# Patient Record
Sex: Female | Born: 1990 | Hispanic: No | Marital: Single | Smoking: Never smoker
Health system: Southern US, Community
[De-identification: ages and names within clinical notes are randomized; demographics above are authoritative.]

---

## 2015-04-19 ENCOUNTER — Ambulatory Visit (INDEPENDENT_AMBULATORY_CARE_PROVIDER_SITE_OTHER): Payer: Managed Care, Other (non HMO) | Admitting: Nurse Practitioner

## 2015-04-19 ENCOUNTER — Encounter: Payer: Self-pay | Admitting: Nurse Practitioner

## 2015-04-19 VITALS — BP 108/74 | HR 60 | Ht 61.75 in | Wt 166.0 lb

## 2015-04-19 DIAGNOSIS — Z01419 Encounter for gynecological examination (general) (routine) without abnormal findings: Secondary | ICD-10-CM | POA: Diagnosis not present

## 2015-04-19 DIAGNOSIS — Z0000 Encounter for general adult medical examination without abnormal findings: Secondary | ICD-10-CM

## 2015-04-19 DIAGNOSIS — Z113 Encounter for screening for infections with a predominantly sexual mode of transmission: Secondary | ICD-10-CM | POA: Diagnosis not present

## 2015-04-19 LAB — POCT URINALYSIS DIPSTICK
BILIRUBIN UA: NEGATIVE
Blood, UA: NEGATIVE
Glucose, UA: NEGATIVE
Ketones, UA: NEGATIVE
LEUKOCYTES UA: NEGATIVE
Nitrite, UA: NEGATIVE
Protein, UA: NEGATIVE
Urobilinogen, UA: NEGATIVE
pH, UA: 6

## 2015-04-19 LAB — HEMOGLOBIN, FINGERSTICK: Hemoglobin, fingerstick: 12.5 g/dL (ref 12.0–16.0)

## 2015-04-19 LAB — POCT URINE PREGNANCY: Preg Test, Ur: NEGATIVE

## 2015-04-19 MED ORDER — NORETHIN ACE-ETH ESTRAD-FE 1-20 MG-MCG PO TABS: 1.0000 | ORAL_TABLET | Freq: Every day | ORAL | Status: AC

## 2015-04-19 NOTE — Progress Notes (Signed)
Patient ID: Tracey Ramirez, female   DOB: 10/16/1991, 24 y.o.   MRN: [id number] 24 y.o. G0 Single  AA Fe here for NGYN annual exam.  Menses is regular last for a week.  Heavy for 3-4 days.  Super pad changing every 4 hours.  Some cramps X 1 days.  Same partner for 3 months.  Condoms most of time.  Desires birth control. Friend has Implanon and would like to consider this.  She has not had recent STD's.  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    Patient's last menstrual period was 03/26/2015 (exact date).          Sexually active: Yes.    The current method of family planning is condoms most of the time.    Exercising: Yes.    starting to work out again, nothing on a regular basis yet Smoker:  no  Health Maintenance: Pap:  ?2013, normal per patient TDaP:  04/01/15 Gardasil: completed age 24 Labs:  HB:  12.5  Urine:  Negative   UPT: negative   reports that she has never smoked. She has never used smokeless tobacco. She reports that she does not drink alcohol or use illicit drugs.  History reviewed. No pertinent past medical history.  History reviewed. No pertinent past surgical history.  Current Outpatient Prescriptions  Medication Sig Dispense Refill  . norethindrone-ethinyl estradiol (JUNEL FE,GILDESS FE,LOESTRIN FE) 1-20 MG-MCG tablet Take 1 tablet by mouth daily. 3 Package 0   No current facility-administered medications for this visit.    Family History  Problem Relation Age of Onset  . Hypertension Mother   . Diabetes Father   . Cancer Maternal Aunt     unknown type  . Breast cancer Paternal Aunt   . Cancer Maternal Aunt     unknown type    ROS:  Pertinent items are noted in HPI.  Otherwise, a comprehensive ROS was negative.  Exam:   BP 108/74 mmHg  Pulse 60  Ht 5' 1.75" (1.568 m)  Wt 166 lb (75.297 kg)  BMI 30.63 kg/m2  LMP 03/26/2015 (Exact Date) Height: 5' 1.75" (156.8 cm) Ht Readings from Last 3 Encounters:  04/19/15 5' 1.75"  (1.568 m)    General appearance: alert, cooperative and appears stated age Head: Normocephalic, without obvious abnormality, atraumatic Neck: no adenopathy, supple, symmetrical, trachea midline and thyroid normal to inspection and palpation Lungs: clear to auscultation bilaterally Breasts: normal appearance, no masses or tenderness Heart: regular rate and rhythm Abdomen: soft, non-tender; no masses,  no organomegaly Extremities: extremities normal, atraumatic, no cyanosis or edema Skin: Skin color, texture, turgor normal. No rashes or lesions Lymph nodes: Cervical, supraclavicular, and axillary nodes normal. No abnormal inguinal nodes palpated Neurologic: Grossly normal   Pelvic: External genitalia:  no lesions              Urethra:  normal appearing urethra with no masses, tenderness or lesions              Bartholin's and Skene's: normal                 Vagina: normal appearing vagina with normal color and discharge, no lesions              Cervix: anteverted              Pap taken: Yes.   Bimanual Exam:  Uterus:  normal size, contour, position, consistency, mobility, non-tender  Adnexa: no mass, fullness, tenderness               Rectovaginal: Confirms               Anus:  normal sphincter tone, no lesions  Chaperone present:  yes  A:  Well Woman with normal exam  Contraception needs  R/O STD's  History of acne  P:   Reviewed health and wellness pertinent to exam  Pap smear taken today  Reviewed all methods of birth control including OCP, Implanon, Nuva Ring, Skyla IUD, and Depo Provera.  Each was discussed with benefits and risk.  She now decides she would feel more comfortable with the OCP.  Rx for Loestrin Fe 1/20 for 3 months and will do a recheck in 3 months - call if problems  She is given BUM, start date, and potential side effects.  Will follow with labs and pap  Counseled on breast self exam, use and side effects of OCP's, adequate intake of calcium  and vitamin D, diet and exercise, Kegel's exercises return annually or prn  An After Visit Summary was printed and given to the patient.

## 2015-04-19 NOTE — Patient Instructions (Addendum)
General topics  Next pap or exam is  due in 1 year Take a Women's multivitamin Take 1200 mg. of calcium daily - prefer dietary If any concerns in interim to call back  Breast Self-Awareness Practicing breast self-awareness may pick up problems early, prevent significant medical complications, and possibly save your life. By practicing breast self-awareness, you can become familiar with how your breasts look and feel and if your breasts are changing. This allows you to notice changes early. It can also offer you some reassurance that your breast health is good. One way to learn what is normal for your breasts and whether your breasts are changing is to do a breast self-exam. If you find a lump or something that was not present in the past, it is best to contact your caregiver right away. Other findings that should be evaluated by your caregiver include nipple discharge, especially if it is bloody; skin changes or reddening; areas where the skin seems to be pulled in (retracted); or new lumps and bumps. Breast pain is seldom associated with cancer (malignancy), but should also be evaluated by a caregiver. BREAST SELF-EXAM The best time to examine your breasts is 5 7 days after your menstrual period is over.  ExitCare Patient Information 2013 [employer].   Exercise to Stay Healthy Exercise helps you become and stay healthy. EXERCISE IDEAS AND TIPS Choose exercises that:  You enjoy.  Fit into your day. You do not need to exercise really hard to be healthy. You can do exercises at a slow or medium level and stay healthy. You can:  Stretch before and after working out.  Try yoga, Pilates, or tai chi.  Lift weights.  Walk fast, swim, jog, run, climb stairs, bicycle, dance, or rollerskate.  Take aerobic classes. Exercises that burn about 150 calories:  Running 1  miles in 15 minutes.  Playing volleyball for 45 to 60 minutes.  Washing and waxing a car for 45 to 60  minutes.  Playing touch football for 45 minutes.  Walking 1  miles in 35 minutes.  Pushing a stroller 1  miles in 30 minutes.  Playing basketball for 30 minutes.  Raking leaves for 30 minutes.  Bicycling 5 miles in 30 minutes.  Walking 2 miles in 30 minutes.  Dancing for 30 minutes.  Shoveling snow for 15 minutes.  Swimming laps for 20 minutes.  Walking up stairs for 15 minutes.  Bicycling 4 miles in 15 minutes.  Gardening for 30 to 45 minutes.  Jumping rope for 15 minutes.  Washing windows or floors for 45 to 60 minutes. Document Released: 12/23/2010 Document Revised: 02/12/2012 Document Reviewed: 12/23/2010 ExitCare Patient Information 2013 [employer].   Other topics ( that may be useful information):    Sexually Transmitted Disease Sexually transmitted disease (STD) refers to any infection that is passed from person to person during sexual activity. This may happen by way of saliva, semen, blood, vaginal mucus, or urine. Common STDs include:  Gonorrhea.  Chlamydia.  Syphilis.  HIV/AIDS.  Genital herpes.  Hepatitis B and C.  Trichomonas.  Human papillomavirus (HPV).  Pubic lice. CAUSES  An STD may be spread by bacteria, virus, or parasite. A person can get an STD by:  Sexual intercourse with an infected person.  Sharing sex toys with an infected person.  Sharing needles with an infected person.  Having intimate contact with the genitals, mouth, or rectal areas of an infected person. SYMPTOMS  Some people may not have any symptoms, but   they can still pass the infection to others. Different STDs have different symptoms. Symptoms include:  Painful or bloody urination.  Pain in the pelvis, abdomen, vagina, anus, throat, or eyes.  Skin rash, itching, irritation, growths, or sores (lesions). These usually occur in the genital or anal area.  Abnormal vaginal discharge.  Penile discharge in men.  Soft, flesh-colored skin growths in the  genital or anal area.  Fever.  Pain or bleeding during sexual intercourse.  Swollen glands in the groin area.  Yellow skin and eyes (jaundice). This is seen with hepatitis. DIAGNOSIS  To make a diagnosis, your caregiver may:  Take a medical history.  Perform a physical exam.  Take a specimen (culture) to be examined.  Examine a sample of discharge under a microscope.  Perform blood test TREATMENT   Chlamydia, gonorrhea, trichomonas, and syphilis can be cured with antibiotic medicine.  Genital herpes, hepatitis, and HIV can be treated, but not cured, with prescribed medicines. The medicines will lessen the symptoms.  Genital warts from HPV can be treated with medicine or by freezing, burning (electrocautery), or surgery. Warts may come back.  HPV is a virus and cannot be cured with medicine or surgery.However, abnormal areas may be followed very closely by your caregiver and may be removed from the cervix, vagina, or vulva through office procedures or surgery. If your diagnosis is confirmed, your recent sexual partners need treatment. This is true even if they are symptom-free or have a negative culture or evaluation. They should not have sex until their caregiver says it is okay. HOME CARE INSTRUCTIONS  All sexual partners should be informed, tested, and treated for all STDs.  Take your antibiotics as directed. Finish them even if you start to feel better.  Only take over-the-counter or prescription medicines for pain, discomfort, or fever as directed by your caregiver.  Rest.  Eat a balanced diet and drink enough fluids to keep your urine clear or pale yellow.  Do not have sex until treatment is completed and you have followed up with your caregiver. STDs should be checked after treatment.  Keep all follow-up appointments, Pap tests, and blood tests as directed by your caregiver.  Only use latex condoms and water-soluble lubricants during sexual activity. Do not use  petroleum jelly or oils.  Avoid alcohol and illegal drugs.  Get vaccinated for HPV and hepatitis. If you have not received these vaccines in the past, talk to your caregiver about whether one or both might be right for you.  Avoid risky sex practices that can break the skin. The only way to avoid getting an STD is to avoid all sexual activity.Latex condoms and dental dams (for oral sex) will help lessen the risk of getting an STD, but will not completely eliminate the risk. SEEK MEDICAL CARE IF:   You have a fever.  You have any new or worsening symptoms. Document Released: 02/10/2003 Document Revised: 02/12/2012 Document Reviewed: 02/17/2011 [facility] Patient Information 2013 [employer].    Domestic Abuse You are being battered or abused if someone close to you hits, pushes, or physically hurts you in any way. You also are being abused if you are forced into activities. You are being sexually abused if you are forced to have sexual contact of any kind. You are being emotionally abused if you are made to feel worthless or if you are constantly threatened. It is important to remember that help is available. No one has the right to abuse you. PREVENTION OF FURTHER  ABUSE  Learn the warning signs of danger. This varies with situations but may include: the use of alcohol, threats, isolation from friends and family, or forced sexual contact. Leave if you feel that violence is going to occur.  If you are attacked or beaten, report it to the police so the abuse is documented. You do not have to press charges. The police can protect you while you or the attackers are leaving. Get the officer's name and badge number and a copy of the report.  Find someone you can trust and tell them what is happening to you: your caregiver, a nurse, clergy member, close friend or family member. Feeling ashamed is natural, but remember that you have done nothing wrong. No one deserves abuse. Document Released:  11/17/2000 Document Revised: 02/12/2012 Document Reviewed: 01/26/2011 ExitCare Patient Information 2013 ExitCare[employer].    How Much is Too Much Alcohol? Drinking too much alcohol can cause injury, accidents, and health problems. These types of problems can include:   Car crashes.  Falls.  Family fighting (domestic violence).  Drowning.  Fights.  Injuries.  Burns.  Damage to certain organs.  Having a baby with birth defects. ONE DRINK CAN BE TOO MUCH WHEN YOU ARE:  Working.  Pregnant or breastfeeding.  Taking medicines. Ask your doctor.  Driving or planning to drive. If you or someone you know has a drinking problem, get help from a doctor.  Document Released: 09/16/2009 Document Revised: 02/12/2012 Document Reviewed: 09/16/2009 ExitCare Patient Information 2013 ExitCare[employer].   Smoking Hazards Smoking cigarettes is extremely bad for your health. Tobacco smoke has over 200 known poisons in it. There are over 60 chemicals in tobacco smoke that cause cancer. Some of the chemicals found in cigarette smoke include:   Cyanide.  Benzene.  Formaldehyde.  Methanol (wood alcohol).  Acetylene (fuel used in welding torches).  Ammonia. Cigarette smoke also contains the poisonous gases nitrogen oxide and carbon monoxide.  Cigarette smokers have an increased risk of many serious medical problems and Smoking causes approximately:  90% of all lung cancer deaths in men.  80% of all lung cancer deaths in women.  90% of deaths from chronic obstructive lung disease. Compared with nonsmokers, smoking increases the risk of:  Coronary heart disease by 2 to 4 times.  Stroke by 2 to 4 times.  Men developing lung cancer by 23 times.  Women developing lung cancer by 13 times.  Dying from chronic obstructive lung diseases by 12 times.  . Smoking is the most preventable cause of death and disease in our society.  WHY IS SMOKING ADDICTIVE?  Nicotine is the chemical  agent in tobacco that is capable of causing addiction or dependence.  When you smoke and inhale, nicotine is absorbed rapidly into the bloodstream through your lungs. Nicotine absorbed through the lungs is capable of creating a powerful addiction. Both inhaled and non-inhaled nicotine may be addictive.  Addiction studies of cigarettes and spit tobacco show that addiction to nicotine occurs mainly during the teen years, when young people begin using tobacco products. WHAT ARE THE BENEFITS OF QUITTING?  There are many health benefits to quitting smoking.   Likelihood of developing cancer and heart disease decreases. Health improvements are seen almost immediately.  Blood pressure, pulse rate, and breathing patterns start returning to normal soon after quitting. QUITTING SMOKING   American Lung Association - 1-800-LUNGUSA  American Cancer Society - 1-800-ACS-2345 Document Released: 12/28/2004 Document Revised: 02/12/2012 Document Reviewed: 09/01/2009 ExitCare Patient Information 2013 ExitCare,   LLC.   Stress Management Stress is a state of physical or mental tension that often results from changes in your life or normal routine. Some common causes of stress are:  Death of a loved one.  Injuries or severe illnesses.  Getting fired or changing jobs.  Moving into a new home. Other causes may be:  Sexual problems.  Business or financial losses.  Taking on a large debt.  Regular conflict with someone at home or at work.  Constant tiredness from lack of sleep. It is not just bad things that are stressful. It may be stressful to:  Win the lottery.  Get married.  Buy a new car. The amount of stress that can be easily tolerated varies from person to person. Changes generally cause stress, regardless of the types of change. Too much stress can affect your health. It may lead to physical or emotional problems. Too little stress (boredom) may also become stressful. SUGGESTIONS TO  REDUCE STRESS:  Talk things over with your family and friends. It often is helpful to share your concerns and worries. If you feel your problem is serious, you may want to get help from a professional counselor.  Consider your problems one at a time instead of lumping them all together. Trying to take care of everything at once may seem impossible. List all the things you need to do and then start with the most important one. Set a goal to accomplish 2 or 3 things each day. If you expect to do too many in a single day you will naturally fail, causing you to feel even more stressed.  Do not use alcohol or drugs to relieve stress. Although you may feel better for a short time, they do not remove the problems that caused the stress. They can also be habit forming.  Exercise regularly - at least 3 times per week. Physical exercise can help to relieve that "uptight" feeling and will relax you.  The shortest distance between despair and hope is often a good night's sleep.  Go to bed and get up on time allowing yourself time for appointments without being rushed.  Take a short "time-out" period from any stressful situation that occurs during the day. Close your eyes and take some deep breaths. Starting with the muscles in your face, tense them, hold it for a few seconds, then relax. Repeat this with the muscles in your neck, shoulders, hand, stomach, back and legs.  Take good care of yourself. Eat a balanced diet and get plenty of rest.  Schedule time for having fun. Take a break from your daily routine to relax. HOME CARE INSTRUCTIONS   Call if you feel overwhelmed by your problems and feel you can no longer manage them on your own.  Return immediately if you feel like hurting yourself or someone else. Document Released: 05/16/2001 Document Revised: 02/12/2012 Document Reviewed: 01/06/2008 ExitCare Patient Information 2013 ExitCare, LLC.  Oral Contraception Information Oral contraceptive  pills (OCPs) are medicines taken to prevent pregnancy. OCPs work by preventing the ovaries from releasing eggs. The hormones in OCPs also cause the cervical mucus to thicken, preventing the sperm from entering the uterus. The hormones also cause the uterine lining to become thin, not allowing a fertilized egg to attach to the inside of the uterus. OCPs are highly effective when taken exactly as prescribed. However, OCPs do not prevent sexually transmitted diseases (STDs). Safe sex practices, such as using condoms along with the pill, can help prevent STDs.    Before taking the pill, you may have a physical exam and Pap test. Your health care provider may order blood tests. The health care provider will make sure you are a good candidate for oral contraception. Discuss with your health care provider the possible side effects of the OCP you may be prescribed. When starting an OCP, it can take 2 to 3 months for the body to adjust to the changes in hormone levels in your body.  TYPES OF ORAL CONTRACEPTION  The combination pill--This pill contains estrogen and progestin (synthetic progesterone) hormones. The combination pill comes in 21-day, 28-day, or 91-day packs. Some types of combination pills are meant to be taken continuously (365-day pills). With 21-day packs, you do not take pills for 7 days after the last pill. With 28-day packs, the pill is taken every day. The last 7 pills are without hormones. Certain types of pills have more than 21 hormone-containing pills. With 91-day packs, the first 84 pills contain both hormones, and the last 7 pills contain no hormones or contain estrogen only.  The minipill--This pill contains the progesterone hormone only. The pill is taken every day continuously. It is very important to take the pill at the same time each day. The minipill comes in packs of 28 pills. All 28 pills contain the hormone.  ADVANTAGES OF ORAL CONTRACEPTIVE PILLS  Decreases premenstrual symptoms.    Treats menstrual period cramps.   Regulates the menstrual cycle.   Decreases a heavy menstrual flow.   May treatacne, depending on the type of pill.   Treats abnormal uterine bleeding.   Treats polycystic ovarian syndrome.   Treats endometriosis.   Can be used as emergency contraception.  THINGS THAT CAN MAKE ORAL CONTRACEPTIVE PILLS LESS EFFECTIVE OCPs can be less effective if:   You forget to take the pill at the same time every day.   You have a stomach or intestinal disease that lessens the absorption of the pill.   You take OCPs with other medicines that make OCPs less effective, such as antibiotics, certain HIV medicines, and some seizure medicines.   You take expired OCPs.   You forget to restart the pill on day 7, when using the packs of 21 pills.  RISKS ASSOCIATED WITH ORAL CONTRACEPTIVE PILLS  Oral contraceptive pills can sometimes cause side effects, such as:  Headache.  Nausea.  Breast tenderness.  Irregular bleeding or spotting. Combination pills are also associated with a small increased risk of:  Blood clots.  Heart attack.  Stroke. Document Released: 02/10/2003 Document Revised: 09/10/2013 Document Reviewed: 05/11/2013 ExitCare Patient Information 2015 ExitCare[employer]. This information is not intended to replace advice given to you by your health care provider. Make sure you discuss any questions you have with your health care provider.  

## 2015-04-20 LAB — STD PANEL
HIV 1&2 Ab, 4th Generation: NONREACTIVE
Hepatitis B Surface Ag: NEGATIVE

## 2015-04-21 LAB — IPS N GONORRHOEA AND CHLAMYDIA BY PCR

## 2015-04-21 LAB — IPS PAP TEST WITH REFLEX TO HPV

## 2015-04-22 ENCOUNTER — Other Ambulatory Visit: Payer: Self-pay | Admitting: Nurse Practitioner

## 2015-04-22 MED ORDER — METRONIDAZOLE 0.75 % VA GEL: 1.0000 | Freq: Every day | VAGINAL | Status: AC

## 2015-04-23 ENCOUNTER — Telehealth: Payer: Self-pay | Admitting: *Deleted

## 2015-04-23 NOTE — Telephone Encounter (Signed)
-----   Message from Ria CommentPatricia Grubb, FNP sent at 04/22/2015  8:03 AM EDT ----- Please let pt. Know that STD's - HIV, Hep B, STS,GC, Chl is all negative.  The pap is normal 08.  The pap does show BV and med's are sent to the pharmacy.

## 2015-04-23 NOTE — Telephone Encounter (Signed)
Pt notified of all results via [phone] (Mobile) per DPR.  Advised medication called to pharmacy for bacterial infection.  Pt advised in message to call with any questions.

## 2015-04-25 NOTE — Progress Notes (Signed)
Encounter reviewed by Dr. Brook Silva.  

## 2015-07-03 ENCOUNTER — Emergency Department (HOSPITAL_COMMUNITY): Payer: Self-pay

## 2015-07-03 ENCOUNTER — Emergency Department (HOSPITAL_COMMUNITY)
Admission: EM | Admit: 2015-07-03 | Discharge: 2015-07-04 | Disposition: A | Discharge status: Home / Self Care | Payer: Self-pay | Attending: Emergency Medicine | Admitting: Emergency Medicine

## 2015-07-03 DIAGNOSIS — Z79899 Other long term (current) drug therapy: Secondary | ICD-10-CM | POA: Insufficient documentation

## 2015-07-03 DIAGNOSIS — Z880 Allergy status to penicillin: Secondary | ICD-10-CM | POA: Insufficient documentation

## 2015-07-03 DIAGNOSIS — R002 Palpitations: Secondary | ICD-10-CM | POA: Insufficient documentation

## 2015-07-03 DIAGNOSIS — R0600 Dyspnea, unspecified: Secondary | ICD-10-CM | POA: Insufficient documentation

## 2015-07-03 DIAGNOSIS — Z3202 Encounter for pregnancy test, result negative: Secondary | ICD-10-CM | POA: Insufficient documentation

## 2015-07-03 LAB — I-STAT CHEM 8, ED
BUN: 12 mg/dL (ref 6–20)
CALCIUM ION: 1.18 mmol/L (ref 1.12–1.23)
CREATININE: 1.1 mg/dL — AB (ref 0.44–1.00)
Chloride: 102 mmol/L (ref 101–111)
Glucose, Bld: 86 mg/dL (ref 65–99)
HCT: 40 % (ref 36.0–46.0)
Hemoglobin: 13.6 g/dL (ref 12.0–15.0)
Potassium: 4.1 mmol/L (ref 3.5–5.1)
Sodium: 138 mmol/L (ref 135–145)
TCO2: 27 mmol/L (ref 0–100)

## 2015-07-03 LAB — I-STAT BETA HCG BLOOD, ED (MC, WL, AP ONLY)

## 2015-07-03 LAB — CBC
HEMATOCRIT: 37.5 % (ref 36.0–46.0)
HEMOGLOBIN: 12.6 g/dL (ref 12.0–15.0)
MCH: 30.2 pg (ref 26.0–34.0)
MCHC: 33.6 g/dL (ref 30.0–36.0)
MCV: 89.9 fL (ref 78.0–100.0)
Platelets: 232 10*3/uL (ref 150–400)
RBC: 4.17 MIL/uL (ref 3.87–5.11)
RDW: 12.5 % (ref 11.5–15.5)
WBC: 6.5 10*3/uL (ref 4.0–10.5)

## 2015-07-03 NOTE — ED Notes (Signed)
Pt reports SOB starting yesterday. Also c/o palpitations saying, "I feel like my heart is beating fast and weird. I'm not having any pain I just feel really tired like I just want to lay down. When I'm sleeping I start breathing harder. I just feel worn out." No SOB noted. RR even/unlabored. Appears fatigued. Denies N/V/D/fever/urinary changes. No other c/c.

## 2015-07-03 NOTE — ED Notes (Signed)
Patient transported to X-ray 

## 2015-07-03 NOTE — ED Notes (Signed)
Pt states she has experienced shortness of breath and palpitations since yesterday.  No chest pain.  She says that the palpitations in her chest have been mostly occurring at night while she is sleeping and that they "make me feel like I want to just stay asleep." No acute distress noted. Family at the bedside.

## 2015-07-03 NOTE — ED Provider Notes (Signed)
CSN: [id number]     Arrival date & time 07/03/15  2307 History   First MD Initiated Contact with Patient 07/03/15 2312     Chief Complaint  Patient presents with  . Shortness of Breath     (Consider location/radiation/quality/duration/timing/severity/associated sxs/prior Treatment) HPI Comments: Patient states yesterday she was really tired and wanted to sleep all day  She than states that in her sleep her heart was beating really fast and she would start breathing harder. Denies N/V/D, edema, dysuria, recent travel, Hx DVT or PE,   Patient is a 24 y.o. female presenting with shortness of breath. The history is provided by the patient.  Shortness of Breath Severity:  Unable to specify Onset quality:  Gradual Duration:  1 day Timing:  Constant Progression:  Worsening Chronicity:  New Relieved by:  None tried Worsened by:  Nothing tried Ineffective treatments:  None tried Associated symptoms: no abdominal pain, no chest pain, no claudication, no cough, no diaphoresis, no ear pain, no fever, no headaches, no hemoptysis, no neck pain, no PND, no rash, no sore throat, no sputum production, no swollen glands, no vomiting and no wheezing   Risk factors: oral contraceptive use   Risk factors: no recent alcohol use, no family hx of DVT, no hx of PE/DVT, no obesity, no prolonged immobilization and no recent surgery     No past medical history on file. No past surgical history on file. Family History  Problem Relation Age of Onset  . Hypertension Mother   . Diabetes Father   . Cancer Maternal Aunt     unknown type  . Breast cancer Paternal Aunt   . Cancer Maternal Aunt     unknown type   History  Substance Use Topics  . Smoking status: Never Smoker   . Smokeless tobacco: Never Used  . Alcohol Use: No   OB History    Gravida Para Term Preterm AB TAB SAB Ectopic Multiple Living   0              Review of Systems  Constitutional: Negative for fever and diaphoresis.  HENT:  Negative for ear pain and sore throat.   Respiratory: Positive for shortness of breath. Negative for cough, hemoptysis, sputum production and wheezing.   Cardiovascular: Negative for chest pain, palpitations, claudication, leg swelling and PND.  Gastrointestinal: Negative for nausea, vomiting and abdominal pain.  Musculoskeletal: Negative for neck pain.  Skin: Negative for rash.  Neurological: Negative for dizziness and headaches.  All other systems reviewed and are negative.     Allergies  Erythromycin and Penicillins  Home Medications   Prior to Admission medications   Medication Sig Start Date End Date Taking? Authorizing Provider  metroNIDAZOLE (METROGEL) 0.75 % vaginal gel Place 1 Applicatorful vaginally at bedtime. Patient not taking: Reported on 07/04/2015 04/22/15   Ria Comment, FNP  norethindrone-ethinyl estradiol (JUNEL FE,GILDESS FE,LOESTRIN FE) 1-20 MG-MCG tablet Take 1 tablet by mouth daily. Patient not taking: Reported on 07/04/2015 04/19/15   Ria Comment, FNP   BP 113/67 mmHg  Pulse 66  Temp(Src) 97.7 F (36.5 C) (Oral)  Resp 18  SpO2 100%  LMP 06/20/2015 (Approximate) Physical Exam  Constitutional: She is oriented to person, place, and time. She appears well-developed and well-nourished.  HENT:  Head: Normocephalic.  Eyes: Pupils are equal, round, and reactive to light.  Neck: Normal range of motion.  Cardiovascular: Normal rate and regular rhythm.   Pulmonary/Chest: Effort normal and breath sounds normal.  Abdominal: Soft.  Musculoskeletal: Normal range of motion. She exhibits no edema or tenderness.  Neurological: She is alert and oriented to person, place, and time.  Skin: Skin is warm.  Nursing note and vitals reviewed.   ED Course  Procedures (including critical care time) Labs Review Labs Reviewed  COMPREHENSIVE METABOLIC PANEL - Abnormal; Notable for the following:    Total Protein 8.3 (*)    All other components within normal limits   I-STAT CHEM 8, ED - Abnormal; Notable for the following:    Creatinine, Ser 1.10 (*)    All other components within normal limits  CBC  URINALYSIS, ROUTINE W REFLEX MICROSCOPIC (NOT AT [facility])  I-STAT BETA HCG BLOOD, ED ([facility], [facility], AP ONLY)    Imaging Review Dg Chest 2 View  07/03/2015   CLINICAL DATA:  Shortness of breath for 1 day.  Palpitations.  EXAM: CHEST  2 VIEW  COMPARISON:  None.  FINDINGS: The cardiomediastinal contours are normal. The lungs are clear. Pulmonary vasculature is normal. No consolidation, pleural effusion, or pneumothorax. No acute osseous abnormalities are seen.  IMPRESSION: No acute pulmonary process.   Electronically Signed   By: Rubye Oaks M.D.   On: 07/03/2015 23:50     EKG Interpretation None     review of patient's labs and x-ray.  Patient is feeling much better after IV fluids.  She will continue getting the remainder of the 1 L bag I'm not finding any anemia.  Abnormal electrolytes, EKG and chest x-ray are all normal.  Creatinine was 1.4, which may slight dehydration.  She does not have a local physician.  She will be given a Facilities manager.  She's been told to document any further episodes of rapid heart rate or deep breathing  MDM   Final diagnoses:  Dyspnea  Palpitations         Earley Favor, NP 07/04/15 8119  Devoria Albe, MD 07/04/15 [phone]

## 2015-07-04 LAB — URINALYSIS, ROUTINE W REFLEX MICROSCOPIC
BILIRUBIN URINE: NEGATIVE
Glucose, UA: NEGATIVE mg/dL
Hgb urine dipstick: NEGATIVE
Ketones, ur: NEGATIVE mg/dL
Leukocytes, UA: NEGATIVE
Nitrite: NEGATIVE
PROTEIN: NEGATIVE mg/dL
SPECIFIC GRAVITY, URINE: 1.017 (ref 1.005–1.030)
UROBILINOGEN UA: 1 mg/dL (ref 0.0–1.0)
pH: 7 (ref 5.0–8.0)

## 2015-07-04 LAB — COMPREHENSIVE METABOLIC PANEL
ALBUMIN: 4.2 g/dL (ref 3.5–5.0)
ALT: 33 U/L (ref 14–54)
ANION GAP: 7 (ref 5–15)
AST: 33 U/L (ref 15–41)
Alkaline Phosphatase: 73 U/L (ref 38–126)
BUN: 12 mg/dL (ref 6–20)
CO2: 27 mmol/L (ref 22–32)
Calcium: 9.2 mg/dL (ref 8.9–10.3)
Chloride: 103 mmol/L (ref 101–111)
Creatinine, Ser: 0.98 mg/dL (ref 0.44–1.00)
GFR calc Af Amer: >60 mL/min (ref >60)
GFR calc non Af Amer: >60 mL/min (ref >60)
Glucose, Bld: 88 mg/dL (ref 65–99)
Potassium: 4.2 mmol/L (ref 3.5–5.1)
SODIUM: 137 mmol/L (ref 135–145)
TOTAL PROTEIN: 8.3 g/dL — AB (ref 6.5–8.1)
Total Bilirubin: 0.6 mg/dL (ref 0.3–1.2)

## 2015-07-04 MED ORDER — SODIUM CHLORIDE 0.9 % IV BOLUS (SEPSIS)
1000.0000 mL | Freq: Once | INTRAVENOUS | Status: AC
  Administered 2015-07-04: 1000 mL via INTRAVENOUS

## 2015-07-04 NOTE — Discharge Instructions (Signed)
Tonight you evaluated for rapid heart rate and deep sigh respirations and fatigue.  No exact cause for your complaints of pain identified.  Tonight, you do not have anemia, EKG and chest x-ray are all within normal parameters.  He was given IV fluids and felt better.  As discussed, your creatinine was slightly elevated, which makes me think you may have been a little dehydrated.  Please try to drink more fluids throughout the day.  Try away from caffeinated beverages.  You also have been given a resource list to help you find a local physician     Emergency Department Resource Guide 1) Find a Doctor and Pay Out of Pocket Although you won't have to find out who is covered by your insurance plan, it is a good idea to ask around and get recommendations. You will then need to call the office and see if the doctor you have chosen will accept you as a new patient and what types of options they offer for patients who are self-pay. Some doctors offer discounts or will set up payment plans for their patients who do not have insurance, but you will need to ask so you aren't surprised when you get to your appointment.  2) Contact Your Local Health Department Not all health departments have doctors that can see patients for sick visits, but many do, so it is worth a call to see if yours does. If you don't know where your local health department is, you can check in your phone book. The CDC also has a tool to help you locate your state's health department, and many state websites also have listings of all of their local health departments.  3) Find a Walk-in Clinic If your illness is not likely to be very severe or complicated, you may want to try a walk in clinic. These are popping up all over the country in pharmacies, drugstores, and shopping centers. They're usually staffed by nurse practitioners or physician assistants that have been trained to treat common illnesses and complaints. They're usually fairly quick  and inexpensive. However, if you have serious medical issues or chronic medical problems, these are probably not your best option.  No Primary Care Doctor: - Call Health Connect at  [phone] - they can help you locate a primary care doctor that  accepts your insurance, provides certain services, etc. - Physician Referral Service- [phone]  Chronic Pain Problems: Organization         Address  Phone   Notes  [facility]  [phone] Patients need to be referred by their primary care doctor.   Medication Assistance: Organization         Address  Phone   Notes  [facility] Medication [facility] [address] [place], [place] [address] [phone] --Must be a resident of [facility] -- Must have NO insurance coverage whatsoever (no Medicaid/ Medicare, etc.) -- The pt. MUST have a primary care doctor that directs their care regularly and follows them in the community   [facility]ist  [phone]   [employer]  [phone]    Agencies that provide inexpensive medical care: Organization         Address  Phone   Notes  [facility]  [phone]   [facility]    [phone]   [facility] [address] [place], [place] [address] [phone]   [facility]  Lovenia Shuck, [place] [phone]   Planned Parenthood    [phone]   [facility]    [phone]   [facility]  [address], [place] Phone:  [phone], Fax:  [phone] Hours of Operation:  9 am - 6 pm, M-F.  Also accepts Medicaid/Medicare and self-pay.  [facility]  [address], [place] Phone: [phone], Fax: [phone]. Hours of Operation:  8:30 am - 5:30 pm, M-F.  Also accepts Medicaid and self-pay.  [facility] [place] [address], [place] Phone: [phone]    [facility] [address] Natasha Bence [place], [place] [phone], Ext. [phone] Mondays & Thursdays: 7-9 AM.  First 15 patients are seen on a first come, first serve basis.    Medicaid-accepting [facility] Providers:  Organization         Address  Phone   Notes  [facility] [address], [place] [phone] Also accepts self-pay patients.  [facility] [address] Laurell Josephs [place], [place]  [phone]   [facility] [address], [place] [phone]   [facility] [address], [place] [phone]   Renaye Rakers [address], [place]   [phone] Only accepts [place] Access [place] patients after they have their name applied to their card.   Self-Pay (no insurance) in [facility]:  Organization         Address  Phone   Notes  Sickle Cell Patients, [facility] [address] [place], [place] [phone]   [facility] [address] [place], [place] [phone]   [facility] [place]  [address],  [phone]   [facility]/Dr. Osei-Bonsu  [address], [place] or [address], [place] [phone] Phone number for both [place] and [place] locations is the same.  [facility] [address] [place] [phone]   [facility] [address] [place] or [address] [phone]   [facility] [address] [place] [phone], phone; [phone], fax Sees patients 1st and 3rd Saturday of every month.  Must not qualify for public or private insurance (i.e. Medicaid, Medicare, [place] Health Choice, Veterans' Benefits)  Household income should be no more than 200% of the poverty level The clinic cannot treat you if you are  pregnant or think you are pregnant  Sexually transmitted diseases are not treated at the clinic.    Dental Care: Organization         Address  Phone  Notes  [facility] Department of [facility] [address] [place], [place] [phone] Accepts children up to age 39 who are enrolled in [place] or [place] Health Choice; pregnant women with a Medicaid card; and children who have applied for Medicaid or [place] Health Choice, but were declined, whose parents can pay a reduced fee at time of service.  [facility] Department of [facility]  [address], [place] [phone] Accepts children up to age 31 who are enrolled in [place] or [place] Health Choice; pregnant women with a Medicaid card; and children who have applied for  Medicaid or [place] Health Choice, but were declined, whose parents can pay a reduced fee at time of service.  [facility]  [address] [place], [place] [phone] Patients are seen by appointment only. Walk-ins are not accepted. [facility] will see patients 99 years of age and older. Monday - Tuesday (8am-5pm) Most Wednesdays (8:30-5pm) $30 per visit, cash only  [facility] Adult Dental Access PROGRAM  [address], [facility] [phone] Patients are seen by appointment only. Walk-ins are not accepted. [facility] will see patients 32 years of age and older. One Wednesday Evening (Monthly: Volunteer Based).  $30 per visit, cash only  [employer]  [phone] for adults; Children under age 68, call Graduate Pediatric Dentistry at [phone]. Children aged 34-14, please call [phone] to request a pediatric application.  Dental services are provided in all areas of dental care including fillings, crowns and bridges, complete and partial dentures, implants, gum treatment, root canals, and extractions. Preventive care is also provided. Treatment is provided to  both adults and children. Patients are selected via a lottery and there is often a waiting list.   [facility] [address] [place]  [phone] [email or web address]   [facility] [address] [place], [place] [phone], Ext. [phone] Second and Fourth Thursday of each month, opens at 6:30 AM; Clinic ends at 9 AM.  Patients are seen on a first-come first-served basis, and a limited number are seen during each clinic.   [facility]  [address] Ether Griffins [place], [place] [phone]   Eligibility Requirements You must have lived in [place], [place], or [place] counties for at least the last three months.   You cannot be eligible for state or federal sponsored [place], including [employer], [place], or [employer].   You generally cannot be eligible for healthcare insurance through your employer.    How to apply: Eligibility screenings are held every Tuesday and Wednesday afternoon from 1:00 pm until 4:00 pm. You do not need an appointment for the interview!  [facility] [address] [place], [place] [phone]   [facility] Health Department  [phone]   [facility] Health Department  [phone]   [facility] Health Department  [phone]    Behavioral Health Resources in the Community: Intensive Outpatient Programs Organization         Address  Phone  Notes  [facility] [address], [place], [place] [phone]   [facility] Outpatient [address], [place], [place] [phone]   [facility] [address], [place], [place]  [phone]   [facility] [address],  [place], [place] [phone] or [phone]   Substance Abuse Resources Organization         Address  Phone  Notes  [facility]  [phone]   [facility]  [phone]   [facility] [place]   [phone]   Floydene Flock  [phone]   Residential & Outpatient Substance Abuse Program  [phone]   Psychological Services Organization         Address  Phone  Notes  [facility] Behavioral Health  [phone]   [facility] Services  [phone]   [facility] [address] [place] [phone] or [phone]    Mobile Crisis Teams Organization         Address  Phone  Notes  [facility], Mobile Crisis Care Unit  [phone]   [facility]  [address] [place], [place] [phone]   [facility] [address] [place] [phone]    Self-Help/Support Groups Organization         Address  Phone             Notes  [facility] [place] - variety of support groups  336- I7437963 Call for more information  Narcotics Anonymous (NA), [facility] [address], [employer] [place]  2 meetings at this location   Statistician         Address  Phone  Notes  [facility] [address],    [place]  [phone]   [facility]  [address] [place] [address], [place], [place] [phone]   [facility] [address] [place], [place] [phone] Admissions: 8am-3pm M-F  [facility] [address]    [place], [place] [phone]   [facility] [address] [place], [place], [place] [phone]   [facility] [address]  [place], [place] [phone]   [facility] - Intensive Outpatient [address] Laurell Josephs [address], [place], [place] [phone]   [facility] ([facility] [address] [place]  [place], [place] [phone] or [phone]   [facility] ([facility]) [address], [place], [place] [phone] Accepts Medicaid  [facility] [address]  [place] [phone] Substance Abuse/Addiction Treatment   [facility] Organization         Address  Phone  Notes  [facility]  [phone]   Angie Fava, PhD [address] Ervin Knack [place], [place]   [phone] or [phone]   [facility] Behavioral   [address] [place], [place] [phone]   [facility] [address] [place], [place] [phone] Insurance/Medicaid/sponsorship through [facility] [address]                                    [place], [place] [phone] Therapy/tele-psych/case  [facility] [address], [place] [phone]    Dr. Lolly Mustache  [phone]   [facility]  [employer] [facility] Dept. 1) [address], [place] 2) [address] [place] 3)  [address], [place] [phone]  [phone]   [facility] Child Abuse Hotline (662) 172-5827 or [phone] (After Hours)

## 2015-07-20 ENCOUNTER — Ambulatory Visit: Payer: Managed Care, Other (non HMO) | Admitting: Nurse Practitioner

## 2015-08-02 ENCOUNTER — Telehealth: Payer: Self-pay | Admitting: Nurse Practitioner

## 2015-08-02 ENCOUNTER — Ambulatory Visit: Payer: Managed Care, Other (non HMO) | Admitting: Nurse Practitioner

## 2015-08-02 NOTE — Telephone Encounter (Signed)
Call to patient about missed appointment for reck. LMTCB

## 2015-08-02 NOTE — Telephone Encounter (Signed)
LMTCB about missed appointment °

## 2015-08-02 NOTE — Telephone Encounter (Signed)
This was for a 3 month recheck so she should be calling to reschedule - if she is still on OCP.

## 2016-04-21 ENCOUNTER — Ambulatory Visit: Payer: Managed Care, Other (non HMO) | Admitting: Nurse Practitioner

## 2016-06-01 ENCOUNTER — Telehealth: Payer: Self-pay | Admitting: *Deleted

## 2016-06-01 NOTE — Telephone Encounter (Signed)
Please contact patient in regards to 08 recall. She was due 5/17 for AEX /PAP.  08 AEX 04/21/16//sp

## 2016-06-02 NOTE — Telephone Encounter (Signed)
Should be 02 recall

## 2016-06-02 NOTE — Telephone Encounter (Signed)
Please advise recall

## 2016-06-02 NOTE — Telephone Encounter (Signed)
Recall changed to 02 and completed. Closing encounter.

## 2016-09-30 IMAGING — CR DG CHEST 2V
2 series · 2 of 2 positions shown · non-contrast
Comparison: None.

CLINICAL DATA: Shortness of breath for 1 day.  Palpitations.

EXAM:
CHEST  2 VIEW

[w chest pa]
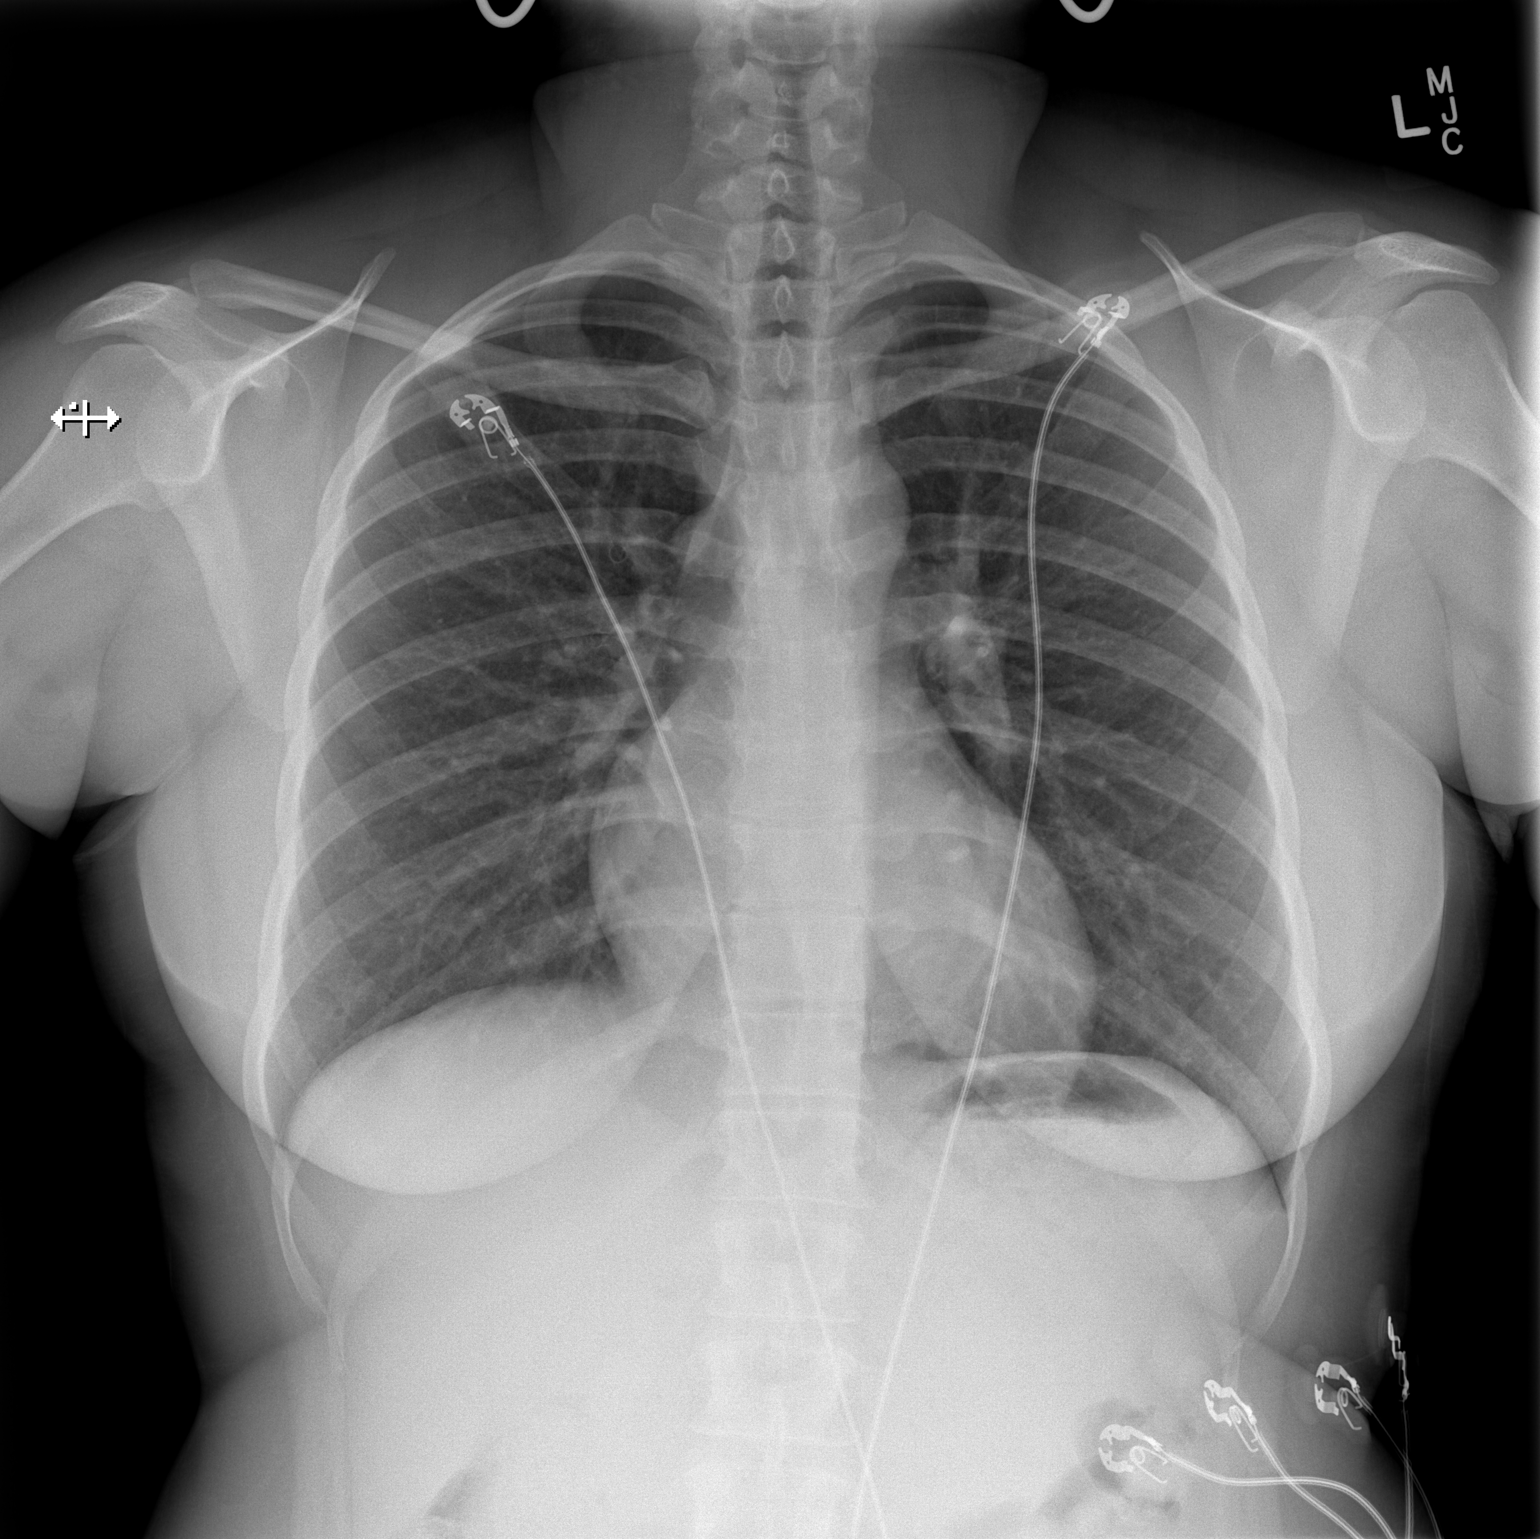

[w chest lat]
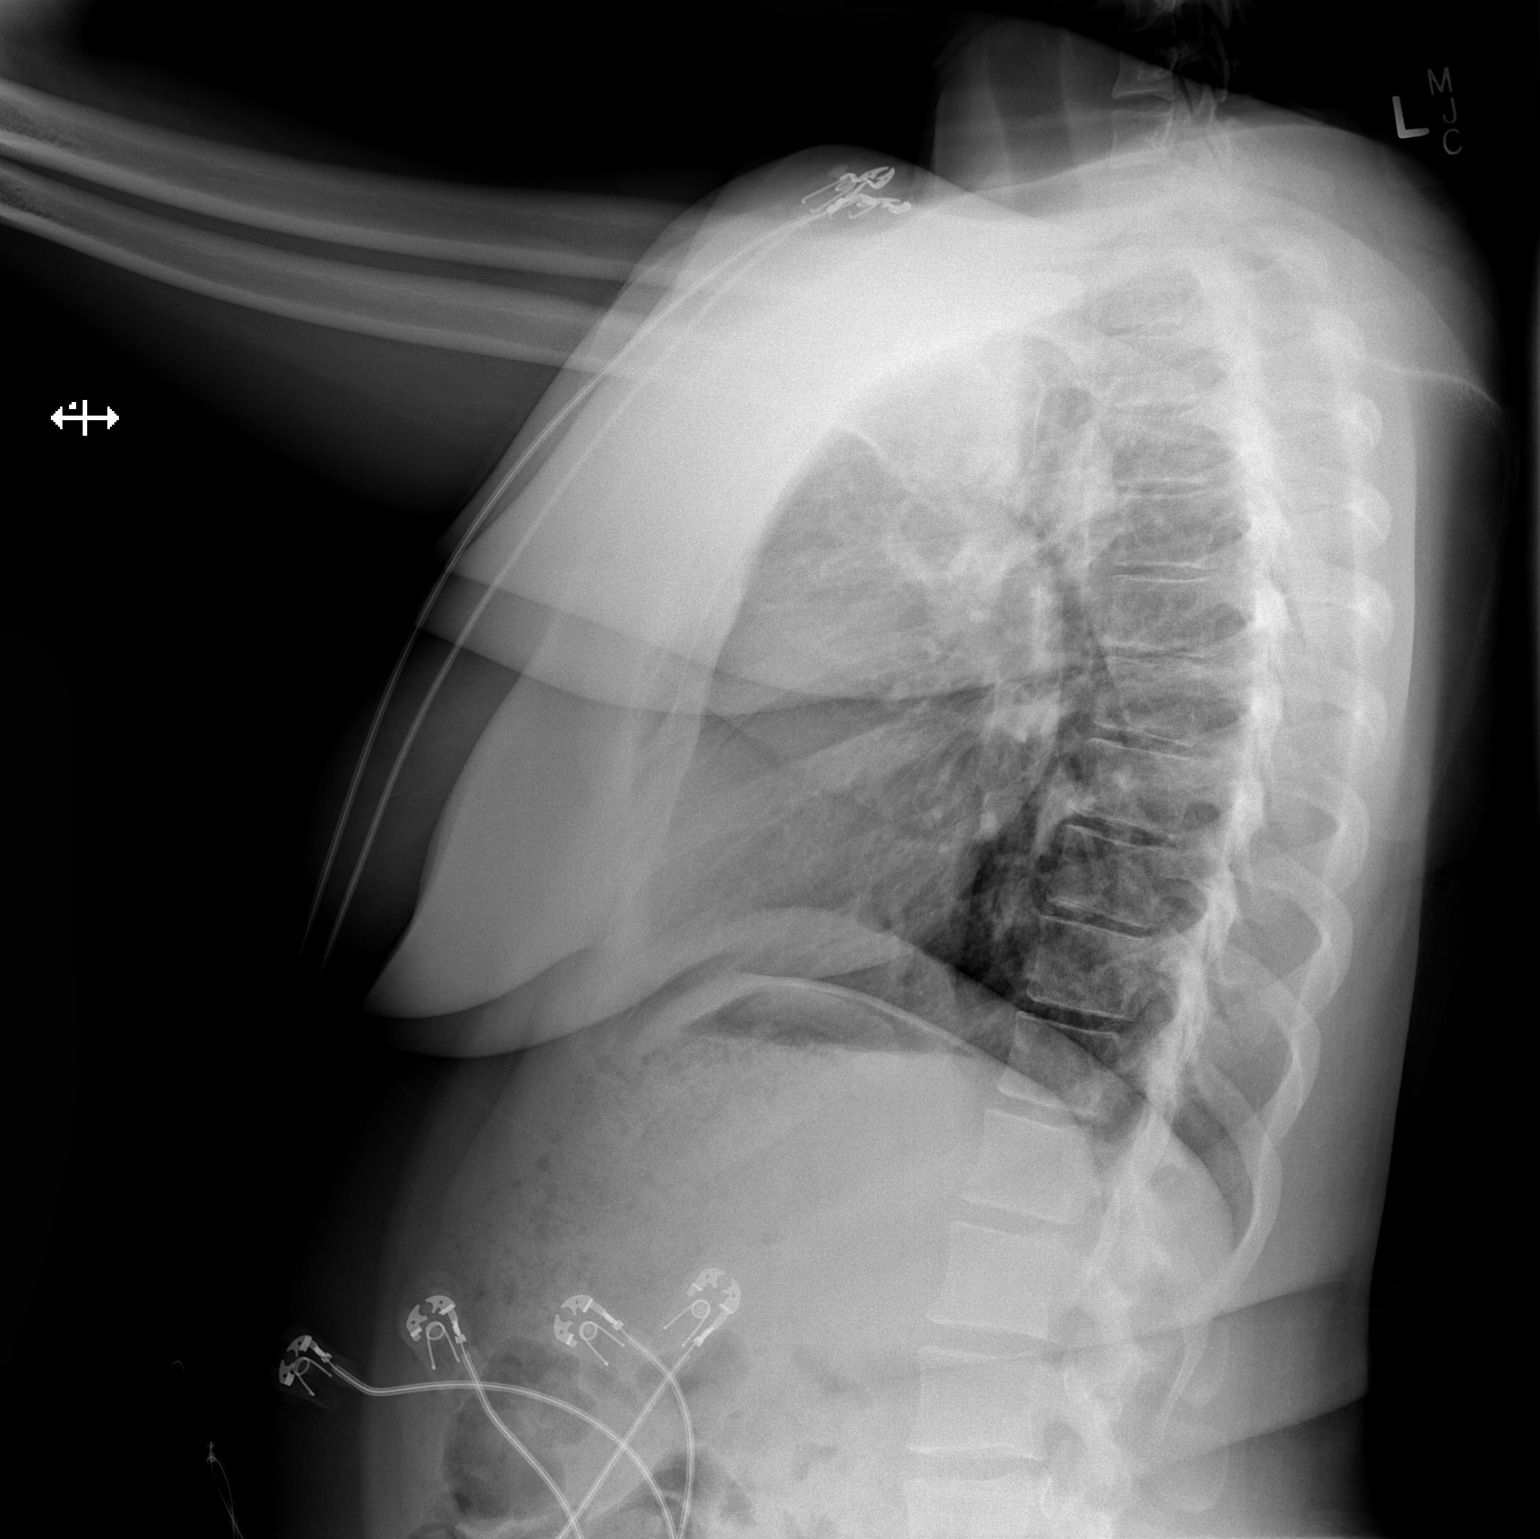

[2 of 2 positions shown; findings below may reference images not displayed]

FINDINGS: The cardiomediastinal contours are normal. The lungs are clear.
Pulmonary vasculature is normal. No consolidation, pleural effusion,
or pneumothorax. No acute osseous abnormalities are seen.
IMPRESSION: No acute pulmonary process.
